# Patient Record
Sex: Female | Born: 1985 | Race: White | Smoking: Never smoker
Health system: Southern US, Community
[De-identification: ages and names within clinical notes are randomized; demographics above are authoritative.]

## PROBLEM LIST (undated history)

## (undated) DIAGNOSIS — G43909 Migraine, unspecified, not intractable, without status migrainosus: Secondary | ICD-10-CM

## (undated) HISTORY — PX: CHOLECYSTECTOMY: SHX55

## (undated) HISTORY — PX: UPPER GASTROINTESTINAL ENDOSCOPY: SHX188

## (undated) HISTORY — PX: GASTRECTOMY: SHX58

## (undated) HISTORY — PX: OTHER SURGICAL HISTORY: SHX169

---

## 2016-05-30 ENCOUNTER — Encounter: Payer: Self-pay | Admitting: Emergency Medicine

## 2016-05-30 ENCOUNTER — Emergency Department
Admission: EM | Admit: 2016-05-30 | Discharge: 2016-05-30 | Disposition: A | Discharge status: Home / Self Care | Attending: Emergency Medicine | Admitting: Emergency Medicine

## 2016-05-30 DIAGNOSIS — O99355 Diseases of the nervous system complicating the puerperium: Secondary | ICD-10-CM | POA: Diagnosis not present

## 2016-05-30 DIAGNOSIS — Z79899 Other long term (current) drug therapy: Secondary | ICD-10-CM | POA: Diagnosis not present

## 2016-05-30 DIAGNOSIS — G43009 Migraine without aura, not intractable, without status migrainosus: Secondary | ICD-10-CM | POA: Insufficient documentation

## 2016-05-30 HISTORY — DX: Migraine, unspecified, not intractable, without status migrainosus: G43.909

## 2016-05-30 MED ORDER — METOCLOPRAMIDE HCL 5 MG/ML IJ SOLN
10.0000 mg | Freq: Once | INTRAMUSCULAR | Status: AC
  Administered 2016-05-30: 10 mg via INTRAVENOUS
  Filled 2016-05-30: qty 2

## 2016-05-30 MED ORDER — SODIUM CHLORIDE 0.9 % IV BOLUS (SEPSIS)
500.0000 mL | Freq: Once | INTRAVENOUS | Status: AC
  Administered 2016-05-30: 500 mL via INTRAVENOUS

## 2016-05-30 MED ORDER — KETOROLAC TROMETHAMINE 30 MG/ML IJ SOLN
30.0000 mg | Freq: Once | INTRAMUSCULAR | Status: AC
Start: 2016-05-30 — End: 2016-05-30
  Administered 2016-05-30: 30 mg via INTRAVENOUS
  Filled 2016-05-30: qty 1

## 2016-05-30 NOTE — ED Provider Notes (Signed)
[facility] Emergency Department Provider Note  ____________________________________________  Time seen: Approximately 2:00 PM  I have reviewed the triage vital signs and the nursing notes.   HISTORY  Chief Complaint Headache    HPI Katherine Dillon is a 30 y.o. female , NAD, presents to the emergency department with 3 day history of migraine headache. Patient states she has a history of migraine headaches in the past. Once she became pregnant her headaches resolved. She is one week postpartum and noted had a frontal migraine headache began 3 days ago. The headache today is the same as headaches she's had in the past in regards to onset and severity but previous headaches resolved with over-the-counter medications. Patient has tried multiple over-the-counter medications with no resolution of her headache at this time. Patient denies any aura. Has had photophobia and phonophobia which is common with her previous headaches. Has had some nausea but no vomiting. Denies any loss of vision, floaters in her vision, chest pain, shortness breath, numbness, weakness, tingling, abdominal pain.   Past Medical History  Diagnosis Date  . Migraines     There are no active problems to display for this patient.   History reviewed. No pertinent past surgical history.  Current Outpatient Rx  Name  Route  Sig  Dispense  Refill  . oxyCODONE-acetaminophen (PERCOCET/ROXICET) 5-325 MG tablet   Oral   Take 1 tablet by mouth every 4 (four) hours as needed for severe pain.           Allergies Review of patient's allergies indicates no known allergies.  History reviewed. No pertinent family history.  Social History Social History  Substance Use Topics  . Smoking status: Never Smoker   . Smokeless tobacco: None  . Alcohol Use: No     Review of Systems  Constitutional: No fever/chills, fatigue Eyes: No visual changes, Loss of vision, floaters ENT: No sinus pressure/pain,  nasal congestion, runny nose. Cardiovascular: No chest pain, palpitations. Respiratory: No shortness of breath. No wheezing.  Gastrointestinal: Positive nausea. No abdominal pain.  No vomiting.   Musculoskeletal: Negative for neck pain.  Skin: Negative for rash. Neurological: Positive for frontal migraine headaches, but no focal weakness or numbness. No tingling. 10-point ROS otherwise negative.  ____________________________________________   PHYSICAL EXAM:  VITAL SIGNS: ED Triage Vitals  Enc Vitals Group     BP 05/30/16 1349 132/73 mmHg     Pulse Rate 05/30/16 1349 78     Resp 05/30/16 1349 20     Temp 05/30/16 1349 97.7 F (36.5 C)     Temp Source 05/30/16 1349 Oral     SpO2 05/30/16 1349 96 %     Weight 05/30/16 1349 249 lb (112.946 kg)     Height 05/30/16 1349  (1.702 m)     Head Cir --      Peak Flow --      Pain Score 05/30/16 1350 10     Pain Loc --      Pain Edu? --      Excl. in GC? --      Constitutional: Alert and oriented. Well appearing and in no acute distressSitting in a darkened exam room. Eyes: Conjunctivae are normal. PERRL. EOMI without pain.  Head: Atraumatic. Neck: Supple with full range of motion Hematological/Lymphatic/Immunilogical: No cervical lymphadenopathy. Cardiovascular: Normal rate, regular rhythm. Normal S1 and S2. No murmurs, rubs, gallops. Good peripheral circulation. Respiratory: Normal respiratory effort without tachypnea or retractions. Lungs CTAB with breath sounds noted in  all lung fields. Neurologic:  Normal speech and language. No gross focal neurologic deficits are appreciated.  Skin:  Skin is warm, dry and intact. No rash noted. Psychiatric: Mood and affect are normal. Speech and behavior are normal. Patient exhibits appropriate insight and judgement.   ____________________________________________    LABS  None ____________________________________________  EKG  None ____________________________________________  RADIOLOGY  None ____________________________________________    PROCEDURES  Procedure(s) performed: None    Medications  ketorolac (TORADOL) 30 MG/ML injection 30 mg (30 mg Intravenous Given 05/30/16 1439)  metoCLOPramide (REGLAN) injection 10 mg (10 mg Intravenous Given 05/30/16 1437)  sodium chloride 0.9 % bolus 500 mL (500 mLs Intravenous New Bag/Given 05/30/16 1437)   ----------------------------------------- 3:12 PM on 05/30/2016 -----------------------------------------  Patient notes headache had significantly decreased and reports pain as a 5 out of 10 at this time. Patient is tolerating oral medications without any side effects.  ____________________________________________   INITIAL IMPRESSION / ASSESSMENT AND PLAN / ED COURSE  Patient's diagnosis is consistent with Migraine without are and without status migrainosus, not intractable. Patient will be discharged home with instructions to follow up with her primary care provider or OB/GYN to discuss safe abortive headache medications that she may use that would be safe while breast-feeding.  Patient is given ED precautions to return to the ED for any worsening or new symptoms.    ____________________________________________  FINAL CLINICAL IMPRESSION(S) / ED DIAGNOSES  Final diagnoses:  Migraine without aura and without status migrainosus, not intractable      NEW MEDICATIONS STARTED DURING THIS VISIT:  New Prescriptions   No medications on file         Hope Pigeon, PA-C 05/30/16 1542   Arnaldo Natal, MD 06/10/16 [phone]

## 2016-05-30 NOTE — ED Notes (Signed)
Pt to ed with c/o headache x 3 days.  Pt is postpartum 1 week.  Pt was sent here by lactation consultant.  Pt reports she has hx of migraines prior to pregnancy but had none during pregnancy.  Pt tearful at triage.

## 2016-05-30 NOTE — ED Notes (Signed)
NAD noted at time of D/C. Pt denies questions or concerns. Pt ambulatory to the lobby at this time. Pt refused wheelchair to the lobby. Pt escorted to visitor lot at this time.

## 2016-05-30 NOTE — ED Notes (Signed)
Iv started and meds given  Lactation nurse at bedside to assist mother

## 2016-05-30 NOTE — Lactation Note (Signed)
Lactation Consultation Note  Patient Name: Czeslawa Weister LEXNT'Z Date: 05/30/2016  Mom came in for out patient consult, but was unable to complete d/t severe migraine and nausea.  Mom was pale in color and dehydrated.  Walked mom with baby to ER. Visited mom in ER with Migraine.  Discussed Reglan & Toradol as compatibility with breast feeding.  Once attending approved medication, we attempted to latch infant to full breast.  Ludwig Clarks has a tight frenulum which could explain difficulty with latching.  Mom has maintained milk supply by pumping using Spectra DEBP and bottlefeeding him since discharge from hospital.  Ludwig Clarks would not latch with or without nipple shield.  Tried SNS without success.  Mom was getting IV fluids and battling with headache so having a hard time concentrating.  2 1/2 oz of expressed breast milk given via bottle with slow flow nipple for this feeding.  Mom will continue to pump to maintain milk supply and keep trying to put baby to the breast.  If continues to be unsuccessful, mom plans to return to [facility] for out patient lactation consult.  Mom plans to discuss tight frenulum at Pediatrician appointment on Monday, June 01, 2016.  Maternal Data    Feeding    [facility] Score/Interventions                      Lactation Tools Discussed/Used     Consult Status      Louis Meckel 05/30/2016, 4:05 PM

## 2016-05-30 NOTE — Discharge Instructions (Signed)
Migraine Headache °A migraine headache is an intense, throbbing pain on one or both sides of your head. A migraine can last for 30 minutes to several hours. °CAUSES  °The exact cause of a migraine headache is not always known. However, a migraine may be caused when nerves in the brain become irritated and release chemicals that cause inflammation. This causes pain. °Certain things may also trigger migraines, such as: °· Alcohol. °· Smoking. °· Stress. °· Menstruation. °· Aged cheeses. °· Foods or drinks that contain nitrates, glutamate, aspartame, or tyramine. °· Lack of sleep. °· Chocolate. °· Caffeine. °· Hunger. °· Physical exertion. °· Fatigue. °· Medicines used to treat chest pain (nitroglycerine), birth control pills, estrogen, and some blood pressure medicines. °SIGNS AND SYMPTOMS °· Pain on one or both sides of your head. °· Pulsating or throbbing pain. °· Severe pain that prevents daily activities. °· Pain that is aggravated by any physical activity. °· Nausea, vomiting, or both. °· Dizziness. °· Pain with exposure to bright lights, loud noises, or activity. °· General sensitivity to bright lights, loud noises, or smells. °Before you get a migraine, you may get warning signs that a migraine is coming (aura). An aura may include: °· Seeing flashing lights. °· Seeing bright spots, halos, or zigzag lines. °· Having tunnel vision or blurred vision. °· Having feelings of numbness or tingling. °· Having trouble talking. °· Having muscle weakness. °DIAGNOSIS  °A migraine headache is often diagnosed based on: °· Symptoms. °· Physical exam. °· A CT scan or MRI of your head. These imaging tests cannot diagnose migraines, but they can help rule out other causes of headaches. °TREATMENT °Medicines may be given for pain and nausea. Medicines can also be given to help prevent recurrent migraines.  °HOME CARE INSTRUCTIONS °· Only take over-the-counter or prescription medicines for pain or discomfort as directed by your  health care provider. The use of long-term narcotics is not recommended. °· Lie down in a dark, quiet room when you have a migraine. °· Keep a journal to find out what may trigger your migraine headaches. For example, write down: °¨ What you eat and drink. °¨ How much sleep you get. °¨ Any change to your diet or medicines. °· Limit alcohol consumption. °· Quit smoking if you smoke. °· Get 7-9 hours of sleep, or as recommended by your health care provider. °· Limit stress. °· Keep lights dim if bright lights bother you and make your migraines worse. °SEEK IMMEDIATE MEDICAL CARE IF:  °· Your migraine becomes severe. °· You have a fever. °· You have a stiff neck. °· You have vision loss. °· You have muscular weakness or loss of muscle control. °· You start losing your balance or have trouble walking. °· You feel faint or pass out. °· You have severe symptoms that are different from your first symptoms. °MAKE SURE YOU:  °· Understand these instructions. °· Will watch your condition. °· Will get help right away if you are not doing well or get worse. °  °This information is not intended to replace advice given to you by your health care provider. Make sure you discuss any questions you have with your health care provider. °  °Document Released: 10/26/2005 Document Revised: 11/16/2014 Document Reviewed: 07/03/2013 °Elsevier Interactive Patient Education ©2016 [employer] ° °Recurrent Migraine Headache °A migraine headache is very bad, throbbing pain on one or both sides of your head. Recurrent migraines keep coming back. Talk to your doctor about what things may bring on (  trigger) your migraine headaches. °HOME CARE °· Only take medicines as told by your doctor. °· Lie down in a dark, quiet room when you have a migraine. °· Keep a journal to find out if certain things bring on migraine headaches. For example, write down: °¨ What you eat and drink. °¨ How much sleep you get. °¨ Any change to your diet or  medicines. °· Lessen how much alcohol you drink. °· Quit smoking if you smoke. °· Get enough sleep. °· Lessen any stress in your life. °· Keep lights dim if bright lights bother you or make your migraines worse. °GET HELP IF: °· Medicine does not help your migraines. °· Your pain keeps coming back. °· You have a fever. °GET HELP RIGHT AWAY IF:  °· Your migraine becomes really bad. °· You have a stiff neck. °· You have trouble seeing. °· Your muscles are weak, or you lose muscle control. °· You lose your balance or have trouble walking. °· You feel like you will pass out (faint), or you pass out. °· You have really bad symptoms that are different than your first symptoms. °MAKE SURE YOU:  °· Understand these instructions. °· Will watch your condition. °· Will get help right away if you are not doing well or get worse. °  °This information is not intended to replace advice given to you by your health care provider. Make sure you discuss any questions you have with your health care provider. °  °Document Released: 08/04/2008 Document Revised: 10/31/2013 Document Reviewed: 07/03/2013 °Elsevier Interactive Patient Education ©2016 [employer] ° °

## 2016-05-30 NOTE — ED Notes (Signed)
See triage note  Post partum 1 week  Developed headache 3 days ago  Positive nausea states she has a hx of migraines and usually takes OTC meds with relief  Has tried tylenol,bc and ibu  Also has tried oxycodone that she has for c-section  No relief

## 2017-03-11 ENCOUNTER — Ambulatory Visit
Admission: EM | Admit: 2017-03-11 | Discharge: 2017-03-11 | Disposition: A | Discharge status: Home / Self Care | Attending: Family Medicine | Admitting: Family Medicine

## 2017-03-11 DIAGNOSIS — Z3202 Encounter for pregnancy test, result negative: Secondary | ICD-10-CM

## 2017-03-11 DIAGNOSIS — N939 Abnormal uterine and vaginal bleeding, unspecified: Secondary | ICD-10-CM | POA: Diagnosis not present

## 2017-03-11 DIAGNOSIS — Z202 Contact with and (suspected) exposure to infections with a predominantly sexual mode of transmission: Secondary | ICD-10-CM | POA: Diagnosis not present

## 2017-03-11 DIAGNOSIS — R319 Hematuria, unspecified: Secondary | ICD-10-CM

## 2017-03-11 LAB — CHLAMYDIA/NGC RT PCR (ARMC ONLY)
Chlamydia Tr: NOT DETECTED
N gonorrhoeae: NOT DETECTED

## 2017-03-11 LAB — URINALYSIS, COMPLETE (UACMP) WITH MICROSCOPIC
BILIRUBIN URINE: NEGATIVE
GLUCOSE, UA: NEGATIVE mg/dL
LEUKOCYTES UA: NEGATIVE
Nitrite: NEGATIVE
PH: 6 (ref 5.0–8.0)
Protein, ur: 100 mg/dL — AB

## 2017-03-11 LAB — WET PREP, GENITAL
CLUE CELLS WET PREP: NONE SEEN
SPERM: NONE SEEN
TRICH WET PREP: NONE SEEN
WBC WET PREP: NONE SEEN
YEAST WET PREP: NONE SEEN

## 2017-03-11 LAB — PREGNANCY, URINE: PREG TEST UR: NEGATIVE

## 2017-03-11 NOTE — ED Triage Notes (Addendum)
Patient complains of hematuria, frequency, urgency and painful urination. Patient states that she started having symptoms this morning. Patient states that husband has been cheating on her for approx 6 months and would like to be checked for STDS.

## 2017-03-11 NOTE — ED Provider Notes (Signed)
[facility]    CSN: [id number] Arrival date & time: 03/11/17  1603     History   Chief Complaint Chief Complaint  Patient presents with  . Hematuria    HPI Katherine Dillon is a 31 y.o. female.   Patient is a 31 year old white female who knows bloody urine today. She states that her last menstrual period ended about a week ago. She's always been regular no problem. She denies any dysuria frequency but was concerned because blood in her urine. She's had no back pain no pain traveling into her flank area. Explained to her that her urine shows vaginal cells present indicating a poor quality specimen but there was no leukocytes no nitrites no signs of WBCs just blood and epithelial cells. Offered to treat for 3 days for antibiotic await urine culture to see if she needs further treatment and if her symptoms persist she may need have a pelvic exam by her PCP. It is at this time she has informed me that she wants to have a pelvic exam and STD evaluation because her husband she is on her for 6 months and she's not had relations with him for about 2 months. She did have relations 2 nights ago on Tuesday night this being Thursday but did not have any trouble. And she is informing me that she was intelligent up to make sure he used protection. She was going to have blood tests for HIV and syphilis done tonight as well. Past medical history migraines. C-sections 3 she is a gravida 5. No pertinent family medical history relevant to today's visit she's never smoked. NKDA.   The history is provided by the patient. No language interpreter was used.  Hematuria  This is a new problem. The current episode started 6 to 12 hours ago. The problem occurs constantly. The problem has not changed since onset.Pertinent negatives include no chest pain, no abdominal pain, no headaches and no shortness of breath. Nothing relieves the symptoms. She has tried nothing for the symptoms.  Exposure to STD  This is a  new problem. The problem occurs constantly. The problem has not changed since onset.Pertinent negatives include no chest pain, no abdominal pain, no headaches and no shortness of breath. Nothing aggravates the symptoms. Nothing relieves the symptoms. She has tried nothing for the symptoms. The treatment provided no relief.    Past Medical History:  Diagnosis Date  . Migraines     There are no active problems to display for this patient.   Past Surgical History:  Procedure Laterality Date  . CESAREAN SECTION     X 3  . UPPER GASTROINTESTINAL ENDOSCOPY      OB History    Gravida Para Term Preterm AB Living   5       2 3    SAB TAB Ectopic Multiple Live Births   1               Home Medications    Prior to Admission medications   Medication Sig Start Date End Date Taking? Authorizing Provider  oxyCODONE-acetaminophen (PERCOCET/ROXICET) 5-325 MG tablet Take 1 tablet by mouth every 4 (four) hours as needed for severe pain.    Historical Provider, MD    Family History History reviewed. No pertinent family history.  Social History Social History  Substance Use Topics  . Smoking status: Never Smoker  . Smokeless tobacco: Never Used  . Alcohol use No     Allergies   Patient has no known  allergies.   Review of Systems Review of Systems  Respiratory: Negative for shortness of breath.   Cardiovascular: Negative for chest pain.  Gastrointestinal: Negative for abdominal pain.  Genitourinary: Positive for hematuria.  Neurological: Negative for headaches.  All other systems reviewed and are negative.    Physical Exam Triage Vital Signs ED Triage Vitals  Enc Vitals Group     BP 03/11/17 1636 119/78     Pulse Rate 03/11/17 1636 71     Resp 03/11/17 1636 18     Temp 03/11/17 1636 98.4 F (36.9 C)     Temp Source 03/11/17 1636 Oral     SpO2 03/11/17 1636 98 %     Weight 03/11/17 1633 247 lb (112 kg)     Height 03/11/17 1633 5\' 7"  (1.702 m)     Head Circumference  --      Peak Flow --      Pain Score 03/11/17 1633 5     Pain Loc --      Pain Edu? --      Excl. in GC? --    No data found.   Updated Vital Signs BP 119/78 (BP Location: Left Arm)   Pulse 71   Temp 98.4 F (36.9 C) (Oral)   Resp 18   Ht 5\' 7"  (1.702 m)   Wt 247 lb (112 kg)   LMP 03/01/2017   SpO2 98%   BMI 38.69 kg/m   Visual Acuity Right Eye Distance:   Left Eye Distance:   Bilateral Distance:    Right Eye Near:   Left Eye Near:    Bilateral Near:     Physical Exam  Constitutional: She is oriented to person, place, and time. She appears well-developed and well-nourished.  HENT:  Head: Normocephalic and atraumatic.  Left Ear: External ear normal.  Mouth/Throat: Oropharynx is clear and moist.  Eyes: Pupils are equal, round, and reactive to light.  Neck: Normal range of motion.  Pulmonary/Chest: Effort normal.  Abdominal: Soft. Bowel sounds are normal. She exhibits no distension. There is no tenderness. Hernia confirmed negative in the left inguinal area.  Genitourinary: Rectum normal and uterus normal. Rectal exam shows no tenderness. There is no rash on the right labia. There is no rash on the left labia. Right adnexum displays no mass. Left adnexum displays no mass. There is erythema and bleeding in the vagina.  Genitourinary Comments: Patient had blood present in the vaginal vault she also had blood around the introitus as well. Obviously apparently the cause of the blood in the urine. Despite multiple attempts unable to manipulate the vaginal walls well enough to see the cervix. Blood was removed from the vaginal vault with ring forceps. Then attempts were made to find manually palpate the uterus unable to get a good feel of the cervix even with palpation on the fundus no foreign objects so could be detected with examination and visualization but patient was warned that something could still been missed. Rectal examination was unremarkable.  Musculoskeletal: Normal  range of motion.  Neurological: She is alert and oriented to person, place, and time.  Skin: Skin is warm.  Psychiatric: She has a normal mood and affect. Her behavior is normal.  Vitals reviewed.    UC Treatments / Results  Labs (all labs ordered are listed, but only abnormal results are displayed) Labs Reviewed  URINALYSIS, COMPLETE (UACMP) WITH MICROSCOPIC - Abnormal; Notable for the following:       Result Value   Color,  Urine PINK (*)    APPearance CLOUDY (*)    Specific Gravity, Urine >1.030 (*)    Hgb urine dipstick LARGE (*)    Ketones, ur TRACE (*)    Protein, ur 100 (*)    Squamous Epithelial / LPF 6-30 (*)    Bacteria, UA FEW (*)    All other components within normal limits  WET PREP, GENITAL  URINE CULTURE  CHLAMYDIA/NGC RT PCR ([facility] ONLY)  PREGNANCY, URINE  RPR  HIV ANTIBODY (ROUTINE TESTING)    EKG  EKG Interpretation None       Radiology No results found.  Procedures Procedures (including critical care time)  Medications Ordered in UC Medications - No data to display   Initial Impression / Assessment and Plan / UC Course  I have reviewed the triage vital signs and the nursing notes.  Pertinent labs & imaging results that were available during my care of the patient were reviewed by me and considered in my medical decision making (see chart for details).     Patient presents with hematuria but the issue is vaginal bleeding and not hematuria. GC can meet test was done Rosey Batheresa were prepped test done and pending HIV GC culture was also obtained urine culture was obtained also asked for urine pregnancy. If the urine (test is negative will treat the wet prep if needed. We'll see what the urine culture shows the patient needs follow-up for PCP if she continues to have bleeding    Curettes were prepped negative urine pregnancy negative will assume this is just dysfunctional bleeding and monitor patient treat any cultures past. Final Clinical  Impressions(s) / UC Diagnoses   Final diagnoses:  Hematuria, unspecified type  Vagina bleeding  Potential exposure to STD    New Prescriptions New Prescriptions   No medications on file  Note: This dictation was prepared with Dragon dictation along with smaller phrase technology. Any transcriptional errors that result from this process are unintentional.   Hassan RowanEugene Jodilyn Giese, MD 03/11/17 [phone]

## 2017-03-12 LAB — HIV ANTIBODY (ROUTINE TESTING W REFLEX): HIV Screen 4th Generation wRfx: NONREACTIVE

## 2017-03-12 LAB — RPR: RPR Ser Ql: NONREACTIVE

## 2017-03-13 ENCOUNTER — Telehealth: Payer: Self-pay | Admitting: *Deleted

## 2017-03-13 LAB — URINE CULTURE: Special Requests: NORMAL

## 2017-03-13 MED ORDER — AMOXICILLIN-POT CLAVULANATE 875-125 MG PO TABS: 1.0000 | ORAL_TABLET | Freq: Two times a day (BID) | ORAL | 0 refills | Status: AC

## 2017-05-16 ENCOUNTER — Encounter: Payer: Self-pay | Admitting: Emergency Medicine

## 2017-05-16 ENCOUNTER — Emergency Department

## 2017-05-16 ENCOUNTER — Emergency Department
Admission: EM | Admit: 2017-05-16 | Discharge: 2017-05-16 | Disposition: A | Discharge status: Home / Self Care | Attending: Emergency Medicine | Admitting: Emergency Medicine

## 2017-05-16 DIAGNOSIS — R1031 Right lower quadrant pain: Secondary | ICD-10-CM | POA: Insufficient documentation

## 2017-05-16 DIAGNOSIS — N83291 Other ovarian cyst, right side: Secondary | ICD-10-CM | POA: Diagnosis not present

## 2017-05-16 DIAGNOSIS — N83209 Unspecified ovarian cyst, unspecified side: Secondary | ICD-10-CM

## 2017-05-16 DIAGNOSIS — R110 Nausea: Secondary | ICD-10-CM | POA: Insufficient documentation

## 2017-05-16 DIAGNOSIS — Z79891 Long term (current) use of opiate analgesic: Secondary | ICD-10-CM | POA: Insufficient documentation

## 2017-05-16 DIAGNOSIS — R1010 Upper abdominal pain, unspecified: Secondary | ICD-10-CM | POA: Diagnosis present

## 2017-05-16 LAB — URINALYSIS, COMPLETE (UACMP) WITH MICROSCOPIC
BILIRUBIN URINE: NEGATIVE
Bacteria, UA: NONE SEEN
GLUCOSE, UA: NEGATIVE mg/dL
KETONES UR: 80 mg/dL — AB
LEUKOCYTES UA: NEGATIVE
Nitrite: NEGATIVE
PH: 6 (ref 5.0–8.0)
Protein, ur: 30 mg/dL — AB
Specific Gravity, Urine: 1.023 (ref 1.005–1.030)

## 2017-05-16 LAB — CBC
HCT: 37.1 % (ref 35.0–47.0)
HEMOGLOBIN: 12.3 g/dL (ref 12.0–16.0)
MCH: 26.2 pg (ref 26.0–34.0)
MCHC: 33.1 g/dL (ref 32.0–36.0)
MCV: 79.1 fL — AB (ref 80.0–100.0)
PLATELETS: 209 10*3/uL (ref 150–440)
RBC: 4.69 MIL/uL (ref 3.80–5.20)
RDW: 17.1 % — ABNORMAL HIGH (ref 11.5–14.5)
WBC: 6.7 10*3/uL (ref 3.6–11.0)

## 2017-05-16 LAB — COMPREHENSIVE METABOLIC PANEL
ALT: 12 U/L — AB (ref 14–54)
ANION GAP: 11 (ref 5–15)
AST: 13 U/L — ABNORMAL LOW (ref 15–41)
Albumin: 3.7 g/dL (ref 3.5–5.0)
Alkaline Phosphatase: 47 U/L (ref 38–126)
BUN: 10 mg/dL (ref 6–20)
CHLORIDE: 107 mmol/L (ref 101–111)
CO2: 22 mmol/L (ref 22–32)
CREATININE: 0.52 mg/dL (ref 0.44–1.00)
Calcium: 8.9 mg/dL (ref 8.9–10.3)
GFR calc non Af Amer: >60 mL/min (ref >60)
Glucose, Bld: 95 mg/dL (ref 65–99)
POTASSIUM: 2.8 mmol/L — AB (ref 3.5–5.1)
Sodium: 140 mmol/L (ref 135–145)
Total Bilirubin: 0.8 mg/dL (ref 0.3–1.2)
Total Protein: 6.6 g/dL (ref 6.5–8.1)

## 2017-05-16 LAB — POCT PREGNANCY, URINE: Preg Test, Ur: NEGATIVE

## 2017-05-16 LAB — LIPASE, BLOOD: LIPASE: 26 U/L (ref 11–51)

## 2017-05-16 MED ORDER — POTASSIUM CHLORIDE CRYS ER 20 MEQ PO TBCR
40.0000 meq | EXTENDED_RELEASE_TABLET | Freq: Once | ORAL | Status: AC
  Administered 2017-05-16: 40 meq via ORAL
  Filled 2017-05-16: qty 2

## 2017-05-16 MED ORDER — ONDANSETRON HCL 4 MG/2ML IJ SOLN
4.0000 mg | Freq: Once | INTRAMUSCULAR | Status: AC
  Administered 2017-05-16: 4 mg via INTRAVENOUS
  Filled 2017-05-16: qty 2

## 2017-05-16 MED ORDER — SODIUM CHLORIDE 0.9 % IV BOLUS (SEPSIS)
1000.0000 mL | Freq: Once | INTRAVENOUS | Status: AC
  Administered 2017-05-16: 1000 mL via INTRAVENOUS

## 2017-05-16 MED ORDER — MORPHINE SULFATE (PF) 2 MG/ML IV SOLN
2.0000 mg | Freq: Once | INTRAVENOUS | Status: AC
  Administered 2017-05-16: 2 mg via INTRAVENOUS
  Filled 2017-05-16: qty 1

## 2017-05-16 NOTE — ED Notes (Addendum)
Explained to pt that she will need a ride home d/t morphine administration. Pt verbalized understanding and has called family member to come and pick her up. Pt discharged to lobby to wait for ride.

## 2017-05-16 NOTE — ED Notes (Signed)
Patient transported to Ultrasound 

## 2017-05-16 NOTE — ED Notes (Signed)

## 2017-05-16 NOTE — ED Provider Notes (Signed)
[facility] Emergency Department Provider Note  ____________________________________________  Time seen: Approximately 8:10 AM  I have reviewed the triage vital signs and the nursing notes.   HISTORY  Chief Complaint Abdominal Pain   HPI Katherine Dillon is a 31 y.o. female with a history of migraine headaches, and gastrectomy who presents for evaluation of abdominal pain. Patient reports that the pain woke her up from her sleep this morning, severe, contraction like pain located in the right side of her abdomen, constant and nonradiating. Associated with nausea but no vomiting. LMP was a month ago. Patient status post tubal ligation. No dysuria or hematuria, no constipation or diarrhea, no vaginal discharge, no fever or chills. Pain is currently 8 out of 10. Patient denies ever having similar pain in the past. No history of gallstones, kidney stones, or ovarian cysts.  Past Medical History:  Diagnosis Date  . Migraines     There are no active problems to display for this patient.   Past Surgical History:  Procedure Laterality Date  . CESAREAN SECTION     X 3  . UPPER GASTROINTESTINAL ENDOSCOPY      Prior to Admission medications   Medication Sig Start Date End Date Taking? Authorizing Provider  oxyCODONE-acetaminophen (PERCOCET/ROXICET) 5-325 MG tablet Take 1 tablet by mouth every 4 (four) hours as needed for severe pain.    [provider]    Allergies Nsaids  No family history on file.  Social History Social History  Substance Use Topics  . Smoking status: Never Smoker  . Smokeless tobacco: Never Used  . Alcohol use No    Review of Systems  Constitutional: Negative for fever. Eyes: Negative for visual changes. ENT: Negative for sore throat. Neck: No neck pain  Cardiovascular: Negative for chest pain. Respiratory: Negative for shortness of breath. Gastrointestinal: + R sided abdominal pain and nausea. No vomiting or  diarrhea. Genitourinary: Negative for dysuria. Musculoskeletal: Negative for back pain. Skin: Negative for rash. Neurological: Negative for headaches, weakness or numbness. Psych: No SI or HI  ____________________________________________   PHYSICAL EXAM:  VITAL SIGNS: ED Triage Vitals  Enc Vitals Group     BP 05/16/17 0707 (!) 107/55     Pulse Rate 05/16/17 0707 66     Resp 05/16/17 0707 (!) 22     Temp 05/16/17 0707 97.7 F (36.5 C)     Temp Source 05/16/17 0707 Oral     SpO2 05/16/17 0707 100 %     Weight 05/16/17 0708 217 lb (98.4 kg)     Height 05/16/17 0708 5\' 7"  (1.702 m)     Head Circumference --      Peak Flow --      Pain Score 05/16/17 0707 10     Pain Loc --      Pain Edu? --      Excl. in GC? --     Constitutional: Alert and oriented. Well appearing and in no apparent distress. HEENT:      Head: Normocephalic and atraumatic.         Eyes: Conjunctivae are normal. Sclera is non-icteric.       Mouth/Throat: Mucous membranes are moist.       Neck: Supple with no signs of meningismus. Cardiovascular: Regular rate and rhythm. No murmurs, gallops, or rubs. 2+ symmetrical distal pulses are present in all extremities. No JVD. Respiratory: Normal respiratory effort. Lungs are clear to auscultation bilaterally. No wheezes, crackles, or rhonchi.  Gastrointestinal: Soft, ttp over  the RLQ, non distended with positive bowel sounds. No rebound or guarding. Genitourinary: No CVA tenderness. Musculoskeletal: Nontender with normal range of motion in all extremities. No edema, cyanosis, or erythema of extremities. Neurologic: Normal speech and language. Face is symmetric. Moving all extremities. No gross focal neurologic deficits are appreciated. Skin: Skin is warm, dry and intact. No rash noted. Psychiatric: Mood and affect are normal. Speech and behavior are normal.  ____________________________________________   LABS (all labs ordered are listed, but only abnormal  results are displayed)  Labs Reviewed  COMPREHENSIVE METABOLIC PANEL - Abnormal; Notable for the following:       Result Value   Potassium 2.8 (*)    AST 13 (*)    ALT 12 (*)    All other components within normal limits  CBC - Abnormal; Notable for the following:    MCV 79.1 (*)    RDW 17.1 (*)    All other components within normal limits  URINALYSIS, COMPLETE (UACMP) WITH MICROSCOPIC - Abnormal; Notable for the following:    Color, Urine YELLOW (*)    APPearance HAZY (*)    Hgb urine dipstick MODERATE (*)    Ketones, ur 80 (*)    Protein, ur 30 (*)    Squamous Epithelial / LPF 0-5 (*)    All other components within normal limits  LIPASE, BLOOD  POC URINE PREG, ED  POCT PREGNANCY, URINE   ____________________________________________  EKG  none  ____________________________________________  RADIOLOGY  TVUS: Arteriovenous waveforms demonstrated within normal sized ovaries bilaterally. No sonographic evidence to suggest torsion.  Within the right ovary there is a prominent follicle versus hemorrhagic cyst, poorly visualized on current exam. Recommend follow-up pelvic ultrasound in 6-8 weeks to ensure resolution.  CT renal: 1. Nonobstructing left renal stones. Otherwise no urinary stone disease. 2. No acute findings to explain the patient's history of right upper quadrant abdominal pain. ____________________________________________   PROCEDURES  Procedure(s) performed: None Procedures Critical Care performed:  None ____________________________________________   INITIAL IMPRESSION / ASSESSMENT AND PLAN / ED COURSE  31 y.o. female with a history of migraine headaches, and gastrectomy who presents for evaluation of sudden onset severe R sided abdominal pain associated with nausea. Patient is well-appearing and in no distress, her vital signs are within normal limits, she is tender to palpation on the right lower quadrant with no rebound or guarding. Based on the  description of the pain differential diagnoses includes ovarian torsion versus ovarian cyst versus kidney stone. Less likely appendicitis with sudden onset of severe pain that is also in my differential diagnosis. Gallbladder disease also possibility. We'll send patient for transvaginal ultrasound to rule out ovarian pathology. We'll get basic blood work, urinalysis and pregnancy test. We'll treat her pain with IV morphine, fluids and Zofran.   ED course:  Pelvic ultrasound concerning for a right hemorrhagic cyst. Patient found to have hematuria in her urine and therefore she was sent for CT renal to evaluate for kidney stone. CT showing evidence of left renal stones but no ureteral stones. Patient's pain is well-controlled. There is no evidence of torsion. Discussed signs and symptoms of torsion and recommended she return to the emergency room. Patient will be discharged home on supportive care at this time and close follow-up with primary care doctor for repeat ultrasound in 6-8 weeks.  Pertinent labs & imaging results that were available during my care of the patient were reviewed by me and considered in my medical decision making (see chart for details).  ____________________________________________   FINAL CLINICAL IMPRESSION(S) / ED DIAGNOSES  Final diagnoses:  RLQ abdominal pain  Hemorrhagic cyst of ovary      NEW MEDICATIONS STARTED DURING THIS VISIT:  New Prescriptions   No medications on file     Note:  This document was prepared using Dragon voice recognition software and may include unintentional dictation errors.    Don Perking, [place], MD 05/16/17 1057

## 2017-05-16 NOTE — ED Triage Notes (Signed)
Pt arrived via POV from home with reports of right upper quad abdominal pain that started about 1 hour prior arrival. Pt describes the pain as contraction pain. Denies V/D, but does endorse nausea.

## 2017-08-17 ENCOUNTER — Ambulatory Visit
Admission: EM | Admit: 2017-08-17 | Discharge: 2017-08-17 | Disposition: A | Discharge status: Home / Self Care | Attending: Emergency Medicine | Admitting: Emergency Medicine

## 2017-08-17 ENCOUNTER — Encounter: Payer: Self-pay | Admitting: Emergency Medicine

## 2017-08-17 DIAGNOSIS — Z113 Encounter for screening for infections with a predominantly sexual mode of transmission: Secondary | ICD-10-CM | POA: Diagnosis not present

## 2017-08-17 LAB — CHLAMYDIA/NGC RT PCR (ARMC ONLY)
Chlamydia Tr: NOT DETECTED
N gonorrhoeae: NOT DETECTED

## 2017-08-17 LAB — WET PREP, GENITAL
Sperm: NONE SEEN
Trich, Wet Prep: NONE SEEN
YEAST WET PREP: NONE SEEN

## 2017-08-17 NOTE — ED Triage Notes (Signed)
Patient in today to be screening for hepatitis. Patient's boyfriend's ex-girlfriend told him that she has hepatitis. Patient requests to be tested for Hepatitis and STDs.

## 2017-08-17 NOTE — ED Provider Notes (Signed)
[facility] ____________________________________________  Time seen: Approximately 8:42 AM  I have reviewed the triage vital signs and the nursing notes.   HISTORY  Chief Complaint screening for hepatitis   HPI Katherine Dillon is a 31 y.o. female  presenting for request of STD screening and hepatitis testing. Patient reports she just wanted to get checked to make sure. Patient states her current boyfriend's ex-girlfriend reported that she had hepatitis, unsure of details. Patient states that her boyfriend has been tested through his work and was negative. Patient states that she wants to just make sure she is okay. Patient denies any symptoms. She reports that she feels well. Denies any fevers, abdominal pain, back pain, chest pain, shortness of breath, dysuria, vaginal discharge, vaginal pain, lesions or rash. Reports that she does have a history of cold sores, denies any recent. Denies recent sickness. Denies recent antibiotic use.   Patient's last menstrual period was 08/05/2017. Denies pregnancy.   Past Medical History:  Diagnosis Date  . Migraines     There are no active problems to display for this patient.   Past Surgical History:  Procedure Laterality Date  . CESAREAN SECTION     X 3  . tubaligation    . UPPER GASTROINTESTINAL ENDOSCOPY       No current facility-administered medications for this encounter.   Current Outpatient Prescriptions:  .  oxyCODONE-acetaminophen (PERCOCET/ROXICET) 5-325 MG tablet, Take 1 tablet by mouth every 4 (four) hours as needed for severe pain., Disp: , Rfl:   Allergies Nsaids  No family history on file.  Social History Social History  Substance Use Topics  . Smoking status: Never Smoker  . Smokeless tobacco: Never Used  . Alcohol use No    Review of Systems Constitutional: No fever/chills Cardiovascular: Denies chest pain. Respiratory: Denies shortness of breath. Gastrointestinal: No abdominal pain.  No nausea,  no vomiting.  No diarrhea.   Genitourinary: Negative for dysuria. Musculoskeletal: Negative for back pain. Skin: Negative for rash.   ____________________________________________   PHYSICAL EXAM:  VITAL SIGNS: ED Triage Vitals  Enc Vitals Group     BP 08/17/17 0817 96/72     Pulse Rate 08/17/17 0817 (!) 56     Resp 08/17/17 0817 16     Temp 08/17/17 0817 98.5 F (36.9 C)     Temp Source 08/17/17 0817 Oral     SpO2 08/17/17 0817 100 %     Weight 08/17/17 0819 184 lb (83.5 kg)     Height 08/17/17 0819  (1.702 m)     Head Circumference --      Peak Flow --      Pain Score 08/17/17 0820 0     Pain Loc --      Pain Edu? --      Excl. in GC? --     Constitutional: Alert and oriented. Well appearing and in no acute distress. Cardiovascular: Normal rate, regular rhythm. Grossly normal heart sounds.  Good peripheral circulation. Respiratory: Normal respiratory effort without tachypnea nor retractions. Breath sounds are clear and equal bilaterally. No wheezes, rales, rhonchi Neurologic:  Normal speech and language. NSpeech is normal. No gait instability.  Skin:  Skin is warm, dry and intact. No rash noted. Psychiatric: Mood and affect are normal. Speech and behavior are normal. Patient exhibits appropriate insight and judgment   ___________________________________________   LABS (all labs ordered are listed, but only abnormal results are displayed)  Labs Reviewed  WET PREP, GENITAL - Abnormal; Notable for  the following:       Result Value   Clue Cells Wet Prep HPF POC PRESENT (*)    WBC, Wet Prep HPF POC FEW (*)    All other components within normal limits  CHLAMYDIA/NGC RT PCR ([facility] ONLY)  HEPATITIS PANEL, ACUTE  RPR  HIV ANTIBODY (ROUTINE TESTING)  HSV(HERPES SIMPLEX VRS) I + II AB-IGG  HSV(HERPES SIMPLEX VRS) I + II AB-IGM    PROCEDURES Procedures   INITIAL IMPRESSION / ASSESSMENT AND PLAN / ED COURSE  Pertinent labs & imaging results that were  available during my care of the patient were reviewed by me and considered in my medical decision making (see chart for details).  Well-appearing patient. No acute distress. Patient denies symptoms. Requests STD testing and hepatitis testing. Will evaluate for HIV, RPR, hepatitis, herpes, gonorrhea, chlamydia, trichomonas or wet prep. Patient denied symptoms and perform self wet prep. Encourage safe sex practices. Also discussed with patient repeat follow-up especially for HIV and hepatitis in 2-3 months. Discussed follow-up with primary care or health department.  Discussed follow up and return parameters including no resolution or any worsening concerns. Patient verbalized understanding and agreed to plan.   ____________________________________________   FINAL CLINICAL IMPRESSION(S) / ED DIAGNOSES  Final diagnoses:  Screen for STD (sexually transmitted disease)     Discharge Medication List as of 08/17/2017  8:41 AM      Note: This dictation was prepared with Dragon dictation along with smaller phrase technology. Any transcriptional errors that result from this process are unintentional.         Renford Dills, NP 08/17/17 [phone]

## 2017-08-17 NOTE — Discharge Instructions (Signed)
Follow up as discussed. Return to Urgent care for new or worsening concerns.

## 2017-08-18 LAB — HSV(HERPES SIMPLEX VRS) I + II AB-IGM: HSVI/II Comb IgM: 1.12 Ratio — ABNORMAL HIGH (ref 0.00–0.90)

## 2017-08-18 LAB — HSV(HERPES SIMPLEX VRS) I + II AB-IGG: HSV 1 Glycoprotein G Ab, IgG: 25.7 index — ABNORMAL HIGH (ref 0.00–0.90)

## 2017-08-18 LAB — RPR: RPR: NONREACTIVE

## 2017-08-18 LAB — HEPATITIS PANEL, ACUTE
HEP A IGM: NEGATIVE
HEP B C IGM: NEGATIVE
HEP B S AG: NEGATIVE

## 2017-08-18 LAB — HIV ANTIBODY (ROUTINE TESTING W REFLEX): HIV Screen 4th Generation wRfx: NONREACTIVE

## 2017-12-03 ENCOUNTER — Emergency Department
Admission: EM | Admit: 2017-12-03 | Discharge: 2017-12-04 | Disposition: A | Discharge status: Home / Self Care | Attending: Emergency Medicine | Admitting: Emergency Medicine

## 2017-12-03 ENCOUNTER — Emergency Department

## 2017-12-03 ENCOUNTER — Encounter: Payer: Self-pay | Admitting: Emergency Medicine

## 2017-12-03 DIAGNOSIS — Z9049 Acquired absence of other specified parts of digestive tract: Secondary | ICD-10-CM | POA: Diagnosis not present

## 2017-12-03 DIAGNOSIS — R1011 Right upper quadrant pain: Secondary | ICD-10-CM | POA: Diagnosis not present

## 2017-12-03 DIAGNOSIS — N390 Urinary tract infection, site not specified: Secondary | ICD-10-CM | POA: Insufficient documentation

## 2017-12-03 DIAGNOSIS — Z903 Acquired absence of stomach [part of]: Secondary | ICD-10-CM | POA: Insufficient documentation

## 2017-12-03 LAB — CBC
HEMATOCRIT: 37.2 % (ref 35.0–47.0)
Hemoglobin: 12.4 g/dL (ref 12.0–16.0)
MCH: 26.3 pg (ref 26.0–34.0)
MCHC: 33.4 g/dL (ref 32.0–36.0)
MCV: 78.8 fL — AB (ref 80.0–100.0)
Platelets: 267 10*3/uL (ref 150–440)
RBC: 4.72 MIL/uL (ref 3.80–5.20)
RDW: 15.1 % — ABNORMAL HIGH (ref 11.5–14.5)
WBC: 9.5 10*3/uL (ref 3.6–11.0)

## 2017-12-03 LAB — URINALYSIS, COMPLETE (UACMP) WITH MICROSCOPIC
Bilirubin Urine: NEGATIVE
Glucose, UA: NEGATIVE mg/dL
Hgb urine dipstick: NEGATIVE
Ketones, ur: 5 mg/dL — AB
Leukocytes, UA: NEGATIVE
Nitrite: POSITIVE — AB
Protein, ur: NEGATIVE mg/dL
SPECIFIC GRAVITY, URINE: 1.027 (ref 1.005–1.030)
pH: 5 (ref 5.0–8.0)

## 2017-12-03 LAB — COMPREHENSIVE METABOLIC PANEL
ALBUMIN: 4.5 g/dL (ref 3.5–5.0)
ALT: 17 U/L (ref 14–54)
AST: 21 U/L (ref 15–41)
Alkaline Phosphatase: 49 U/L (ref 38–126)
Anion gap: 7 (ref 5–15)
BILIRUBIN TOTAL: 0.6 mg/dL (ref 0.3–1.2)
BUN: 17 mg/dL (ref 6–20)
CHLORIDE: 106 mmol/L (ref 101–111)
CO2: 27 mmol/L (ref 22–32)
Calcium: 9.6 mg/dL (ref 8.9–10.3)
Creatinine, Ser: 0.59 mg/dL (ref 0.44–1.00)
GFR calc Af Amer: >60 mL/min (ref >60)
GFR calc non Af Amer: >60 mL/min (ref >60)
GLUCOSE: 93 mg/dL (ref 65–99)
Potassium: 3.9 mmol/L (ref 3.5–5.1)
Sodium: 140 mmol/L (ref 135–145)
Total Protein: 7.5 g/dL (ref 6.5–8.1)

## 2017-12-03 LAB — LIPASE, BLOOD: LIPASE: 33 U/L (ref 11–51)

## 2017-12-03 LAB — PREGNANCY, URINE: Preg Test, Ur: NEGATIVE

## 2017-12-03 MED ORDER — NITROFURANTOIN MONOHYD MACRO 100 MG PO CAPS
100.0000 mg | ORAL_CAPSULE | Freq: Once | ORAL | Status: AC
  Administered 2017-12-03: 100 mg via ORAL
  Filled 2017-12-03: qty 1

## 2017-12-03 MED ORDER — NITROFURANTOIN MONOHYD MACRO 100 MG PO CAPS: 100.0000 mg | ORAL_CAPSULE | Freq: Two times a day (BID) | ORAL | 0 refills | Status: AC

## 2017-12-03 NOTE — ED Provider Notes (Signed)
[facility] Emergency Department Provider Note  ____________________________________________   First MD Initiated Contact with Patient 12/03/17 2203     (approximate)  I have reviewed the triage vital signs and the nursing notes.   HISTORY  Chief Complaint Abdominal Pain   HPI Ilene Quadna Arteaga is a 32 y.o. 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  She states that she had a ch[facility]cyst[facility] this past November [facility] where she also had her sleeve gastrectomy.  She says that she has been having sharp pain ever since having the surgery.  She says that the pain is usually 4 out of 10 but when she is active such as at work, it is increased to an 8 out of 10.  She says the pain is not associated with any nausea, vomiting or diarrhea.  She says that she made an appointment with her surgeons this coming Tuesday but says that she has been putting this off for too long without it being evaluated and that is why she came to the emergency department tonight.  She denies any vaginal bleeding or discharge.  Denies any urinary frequency or burning with urination but says that she is prone to urinary tract infections.   Past Medical History:  Diagnosis Date  . Migraines     There are no active problems to display for this patient.   Past Surgical History:  Procedure Laterality Date  . CESAREAN SECTION     X 3  . CHOLECYSTECTOMY    . GASTRECTOMY    . tubaligation    . UPPER GASTROINTESTINAL ENDOSCOPY      Prior to Admission medications   Medication Sig Start Date End Date Taking? Authorizing Provider  oxyCODONE-acetaminophen (PERCOCET/ROXICET) 5-325 MG tablet Take 1 tablet by mouth every 4 (four) hours as needed for severe pain.    [provider]    Allergies Nsaids  No family history on file.  Social History Social History    Tobacco Use  . Smoking status: Never Smoker  . Smokeless tobacco: Never Used  Substance Use Topics  . Alcohol use: No  . Drug use: No    Review of Systems  Constitutional: No fever/chills Eyes: No visual changes. ENT: No sore throat. Cardiovascular: Denies chest pain. Respiratory: Denies shortness of breath. Gastrointestinal:  No nausea, no vomiting.  No diarrhea.  No constipation. Genitourinary: Negative for dysuria. Musculoskeletal: Negative for back pain. Skin: Negative for rash. Neurological: Negative for headaches, focal weakness or numbness.   ____________________________________________   PHYSICAL EXAM:  VITAL SIGNS: ED Triage Vitals [12/03/17 2048]  Enc Vitals Group     BP      Pulse      Resp      Temp      Temp src      SpO2      Weight 150 lb (68 kg)     Height 5\' 7"  (1.702 m)     Head Circumference      Peak Flow      Pain Score 8     Pain Loc      Pain Edu?      Excl. in GC?     Constitutional: Alert and oriented. Well appearing and in no acute distress. Eyes: Conjunctivae are normal.  Head: Atraumatic. Nose: No congestion/rhinnorhea. Mouth/Throat: Mucous membranes are moist.  Neck: No stridor.   Cardiovascular: Normal rate, regular  rhythm. Grossly normal heart sounds.  Respiratory: Normal respiratory effort.  No retractions. Lungs CTAB. Gastrointestinal: Soft with moderate right upper quadrant abdominal tenderness to palpation without any rebound or guarding.  Negative Murphy sign. No distention. No CVA tenderness. Musculoskeletal: No lower extremity tenderness nor edema.   Neurologic:  Normal speech and language. No gross focal neurologic deficits are appreciated. Skin:  Skin is warm, dry and intact. No rash noted. Psychiatric: Mood and affect are normal. Speech and behavior are normal.  ____________________________________________   LABS (all labs ordered are listed, but only abnormal results are displayed)  Labs Reviewed  CBC -  Abnormal; Notable for the following components:      Result Value   MCV 78.8 (*)    RDW 15.1 (*)    All other components within normal limits  URINALYSIS, COMPLETE (UACMP) WITH MICROSCOPIC - Abnormal; Notable for the following components:   Color, Urine YELLOW (*)    APPearance HAZY (*)    Ketones, ur 5 (*)    Nitrite POSITIVE (*)    Bacteria, UA MANY (*)    Squamous Epithelial / LPF 0-5 (*)    All other components within normal limits  URINE CULTURE  LIPASE, BLOOD  COMPREHENSIVE METABOLIC PANEL  PREGNANCY, URINE  POC URINE PREG, ED   ____________________________________________  EKG   ____________________________________________  RADIOLOGY  Pending [place] ____________________________________________   PROCEDURES  Procedure(s) performed:   Procedures  Critical Care performed:   ____________________________________________   INITIAL IMPRESSION / ASSESSMENT AND PLAN / ED COURSE  Pertinent labs & imaging results that were available during my care of the patient were reviewed by me and considered in my medical decision making (see chart for details).  Differential diagnosis includes, but is not limited to, biliary disease (biliary colic, acute cholecystitis, cholangitis, choledocholithiasis, etc), intrathoracic causes for epigastric abdominal pain including ACS, gastritis, duodenitis, pancreatitis, small bowel or large bowel obstruction, abdominal aortic aneurysm, hernia, and gastritis. As part of my medical decision making, I reviewed the following data within the electronic MEDICAL RECORD NUMBER Old chart reviewed  ----------------------------------------- 11:10 PM on 12/03/2017 -----------------------------------------  Patient at this time pending ultrasound.  Patient says that she is established an appointment for this Tuesday with her surgeons at [facility].  Signed out to Dr. Manson Passey.      ____________________________________________   FINAL CLINICAL IMPRESSION(S) /  ED DIAGNOSES  Right upper quadrant abdominal pain.  UTI.    NEW MEDICATIONS STARTED DURING THIS VISIT:  New Prescriptions   No medications on file     Note:  This document was prepared using Dragon voice recognition software and may include unintentional dictation errors.     Myrna Blazer, MD 12/03/17 [phone]

## 2017-12-03 NOTE — ED Triage Notes (Signed)
Pt comes into the ED via POV c/o RUQ abdominal pain that is described as a burning sharp pain.  Patient denies any N/V/D, chest pain, or shortness of breath.  Patient states she has already had her gallbladder out but the pain is where that pain used to be.  Patient in NAD at this time with even and unlabored respirations.  Patient states the pain has been going on since November and the pain has been there since the surgery was completed on November 19.  Patient also states that she had the weight loss gastric sleeve completed.

## 2017-12-03 NOTE — ED Notes (Signed)
Patient transported to Ultrasound 

## 2017-12-04 NOTE — ED Provider Notes (Signed)
I assumed care of the patient from Dr. Pershing ProudSchaevitz at 11:00 PM with recommendation to follow-up on ultrasound and if negative patient be able to be discharged home.  Ultrasound revealed no acute pathology and as such patient was discharged home urinalysis consistent with Schaevitz prescribed Macrobid.  I spoke with the patient regarding urine culture and possibility of antibiotic resistance and as such she would be notified if that were the case.  Patient discharged home.   Darci CurrentBrown, [place], MD 12/04/17 Katherine Dillon

## 2017-12-06 LAB — URINE CULTURE: Culture: >=100000 — AB

## 2019-05-23 IMAGING — US US PELVIS COMPLETE
1 series · 13 of 25 positions shown · non-contrast
Comparison: None.

CLINICAL DATA: Patient with right lower quadrant abdominal pain.
Evaluate for torsion.

EXAM:
TRANSABDOMINAL ULTRASOUND OF PELVIS
DOPPLER ULTRASOUND OF OVARIES
TECHNIQUE: Transabdominal ultrasound examination of the pelvis was performed
including evaluation of the uterus, ovaries, adnexal regions, and
pelvic cul-de-sac.
Color and duplex Doppler ultrasound was utilized to evaluate blood
flow to the ovaries.

[Series 1: us pelvis complete · 0.23mm/px · 13 of 79 slices shown]
[im 1/79]
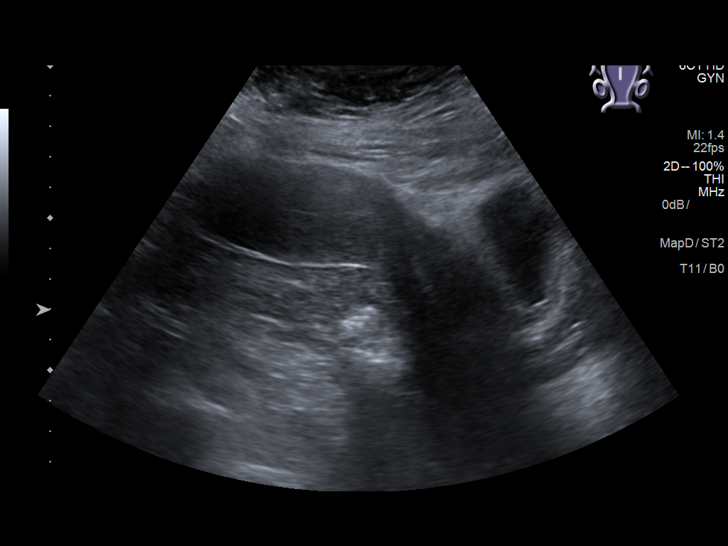
[im 7/79]
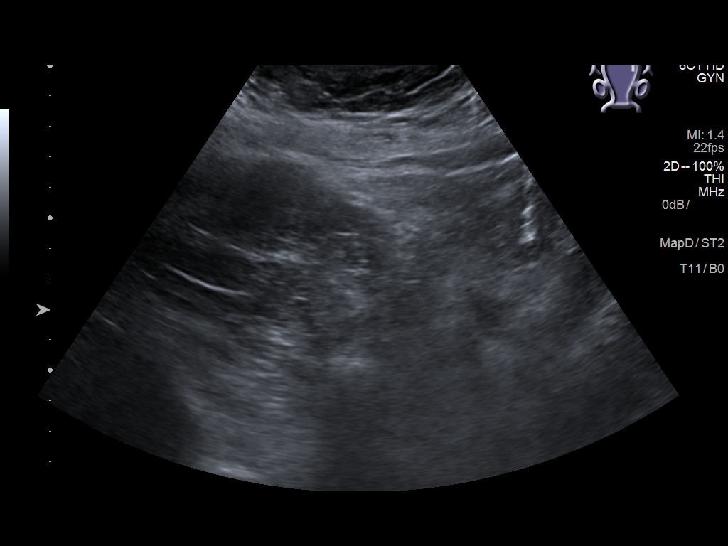
[im 14/79]
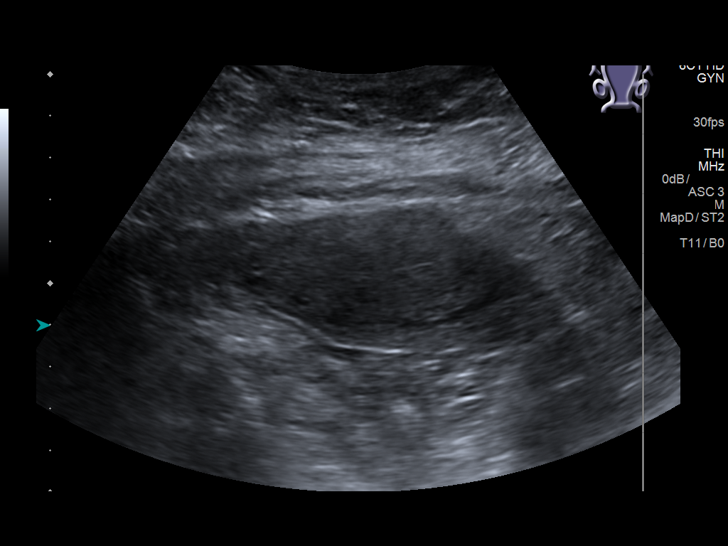
[im 20/79]
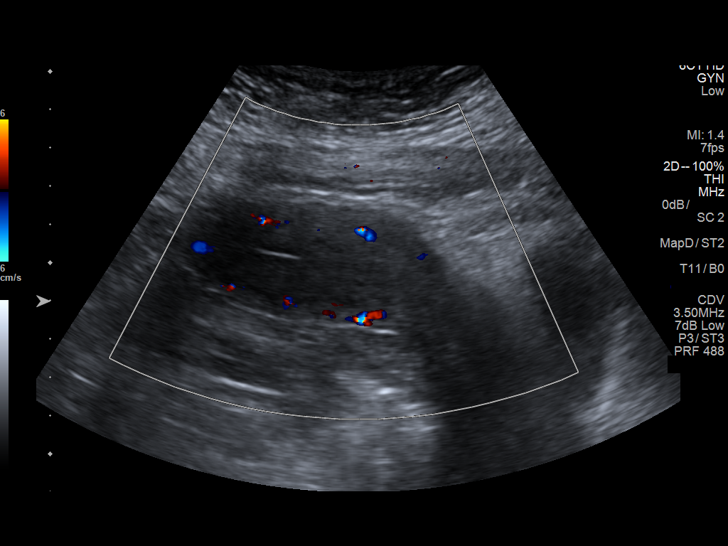
[im 27/79]
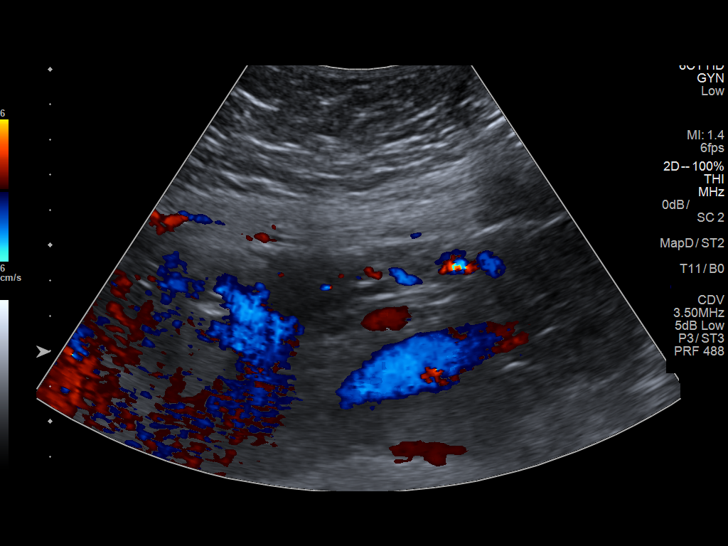
[im 33/79]
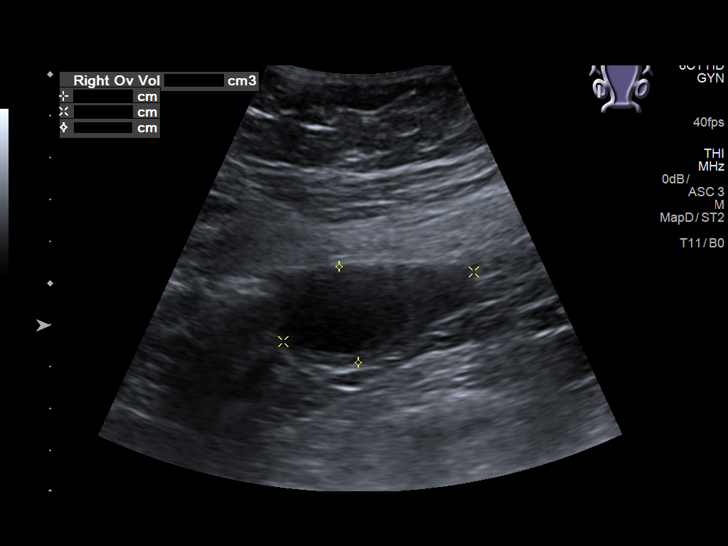
[im 40/79]
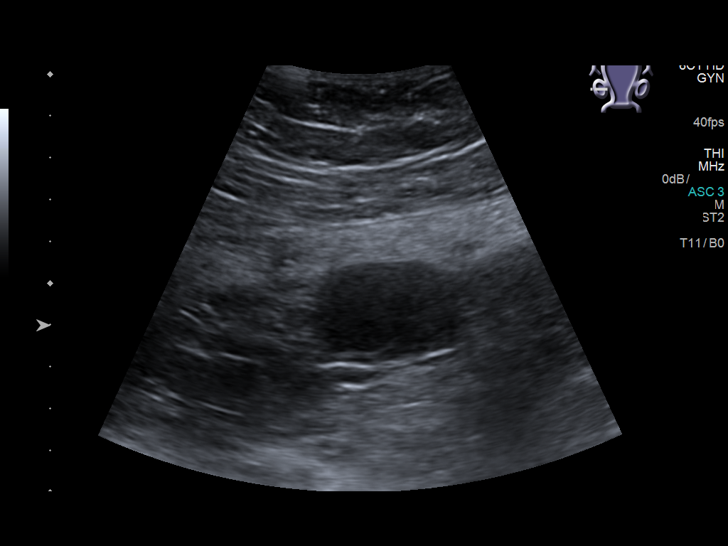
[im 46/79]
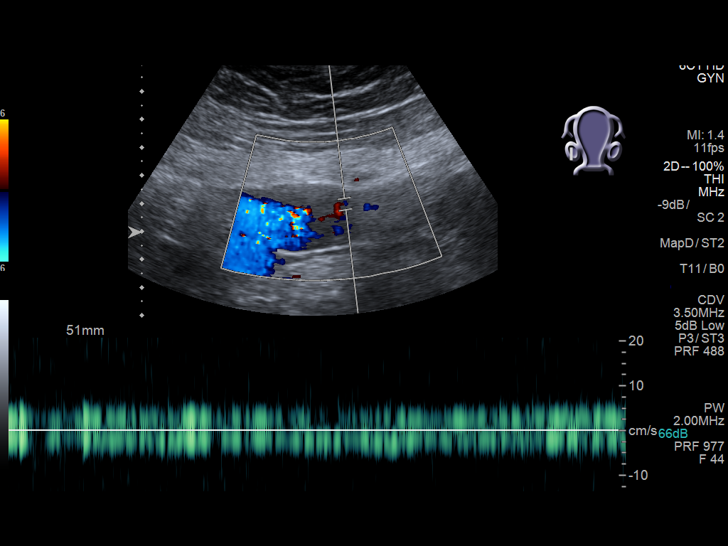
[im 53/79]
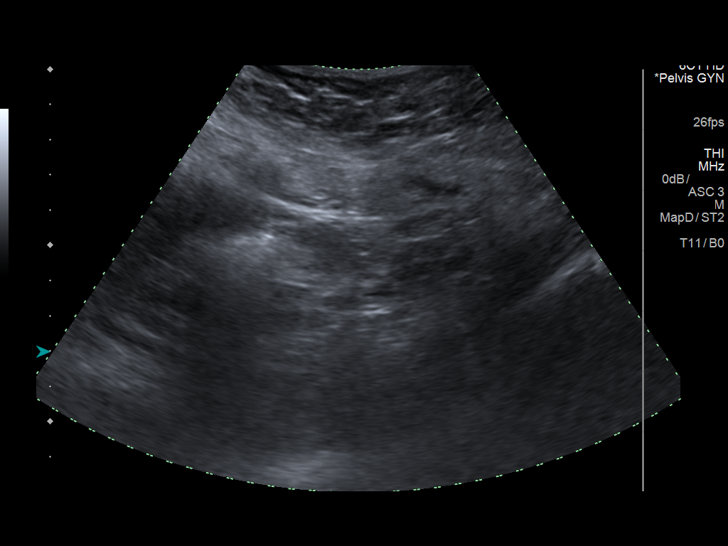
[im 59/79]
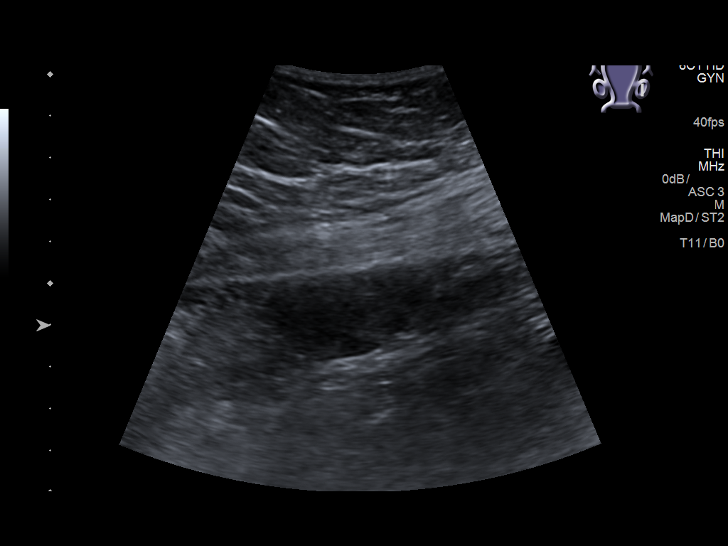
[im 66/79]
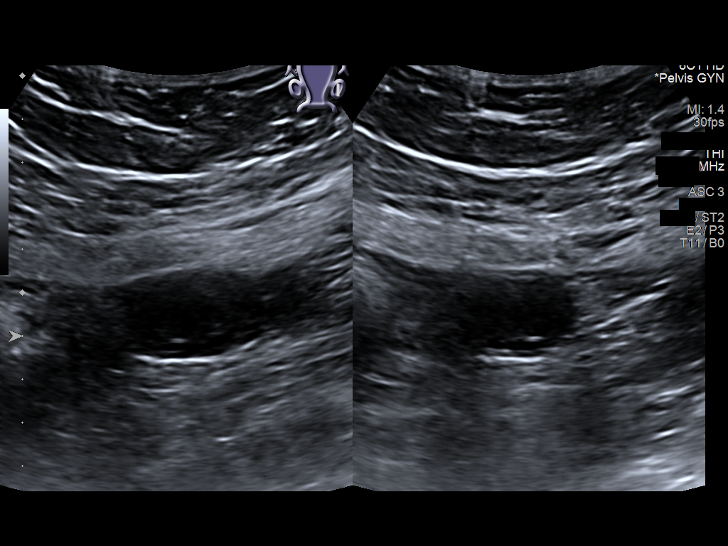
[im 72/79]
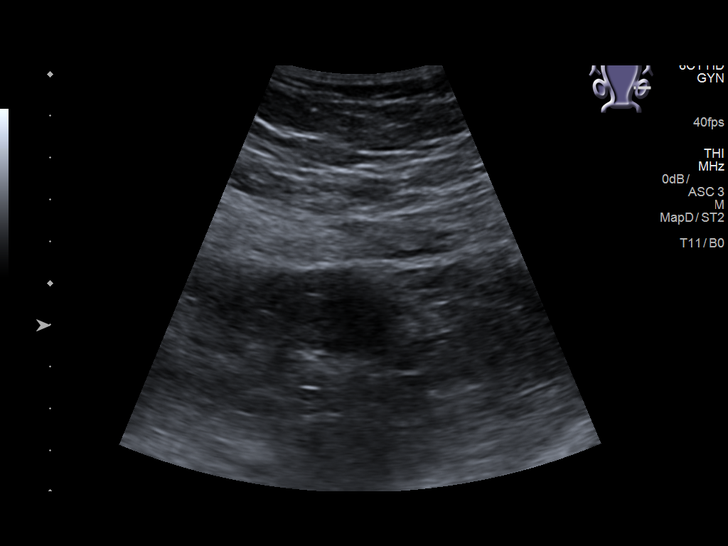
[im 79/79]
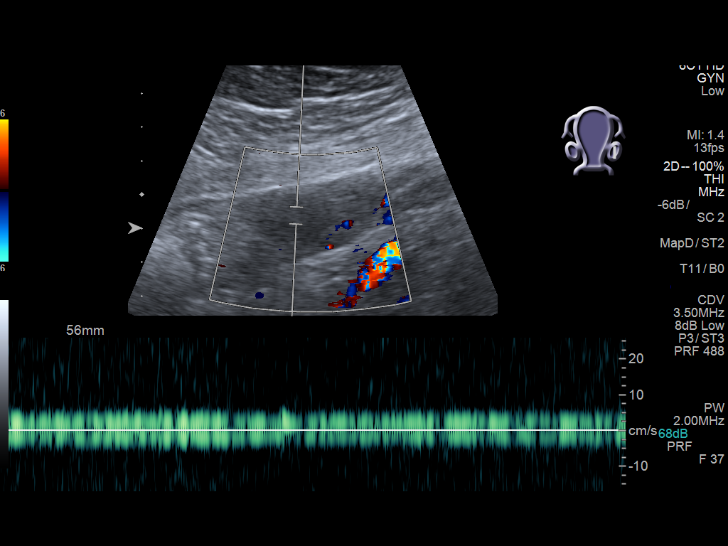

[13 of 25 positions shown; findings below may reference images not displayed]

FINDINGS: Uterus

Measurements: 11.7 x 3.3 x 5.6 cm. No fibroids or other mass
visualized.

Endometrium

Thickness: 4 mm. No focal abnormality visualized.

Right ovary

Measurements: 4.9 x 2.3 x 3.2 cm. Within the right ovary there is a
2.7 x 1.8 x 2.5 cm hypoechoic lesion, potentially representing a
hemorrhagic cyst.

Left ovary

Measurements: 4.0 x 2.1 x 2.8 cm. Normal appearance/no adnexal mass.

Pulsed Doppler evaluation demonstrates normal low-resistance
arterial and venous waveforms in both ovaries.
IMPRESSION: Arteriovenous waveforms demonstrated within normal sized ovaries
bilaterally. No sonographic evidence to suggest torsion.

Within the right ovary there is a prominent follicle versus
hemorrhagic cyst, poorly visualized on current exam. Recommend
follow-up pelvic ultrasound in 6-8 weeks to ensure resolution.

## 2019-12-10 IMAGING — US US ABDOMEN LIMITED
1 series · 14 of 25 positions shown · non-contrast
Comparison: CT abdomen and pelvis 05/16/2017

CLINICAL DATA: Right upper quadrant pain since cholecystectomy in
Saturday September, 2017

EXAM:
ULTRASOUND ABDOMEN LIMITED RIGHT UPPER QUADRANT

[Series 1: us abdomen limited · 0.23mm/px · 14 of 48 slices shown]
[im 1/48]
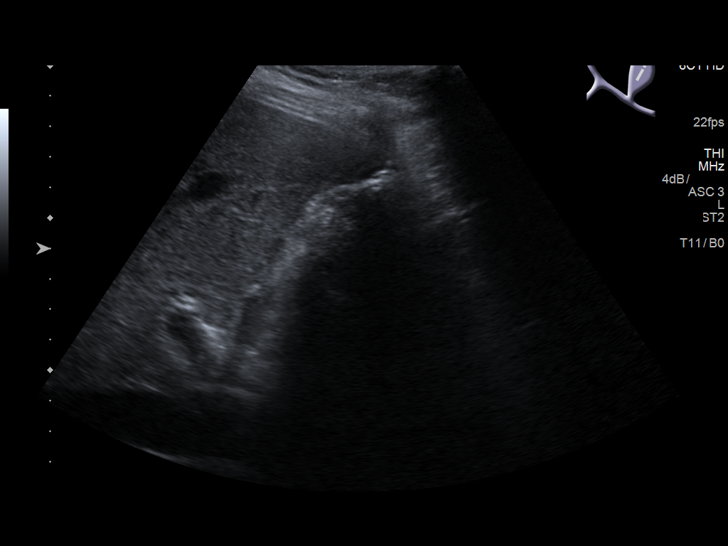
[im 4/48]
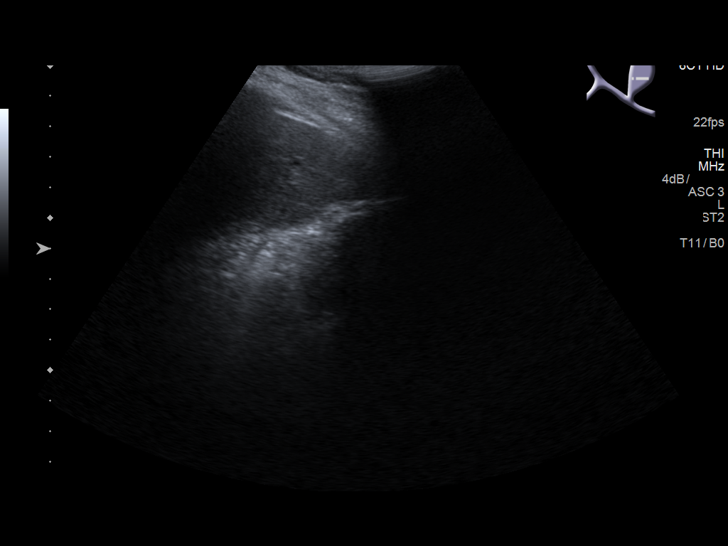
[im 8/48]
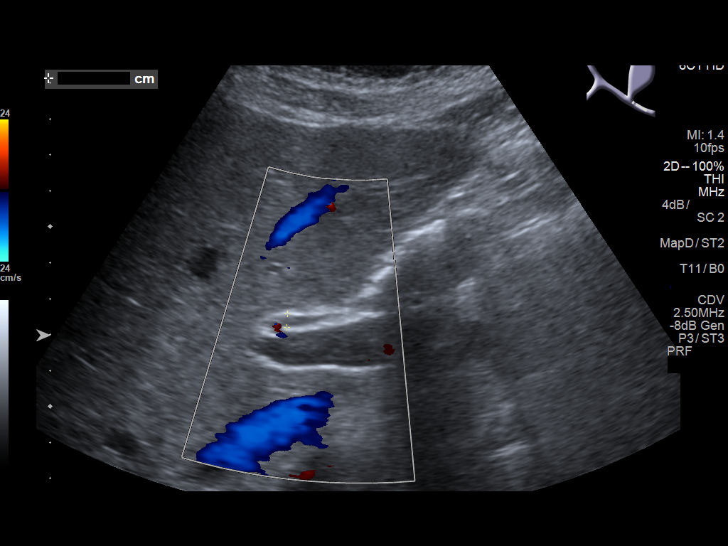
[im 12/48]
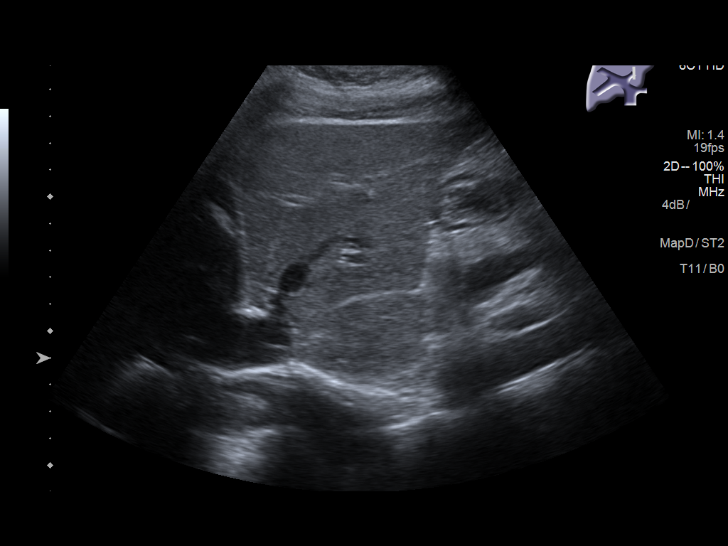
[im 16/48]
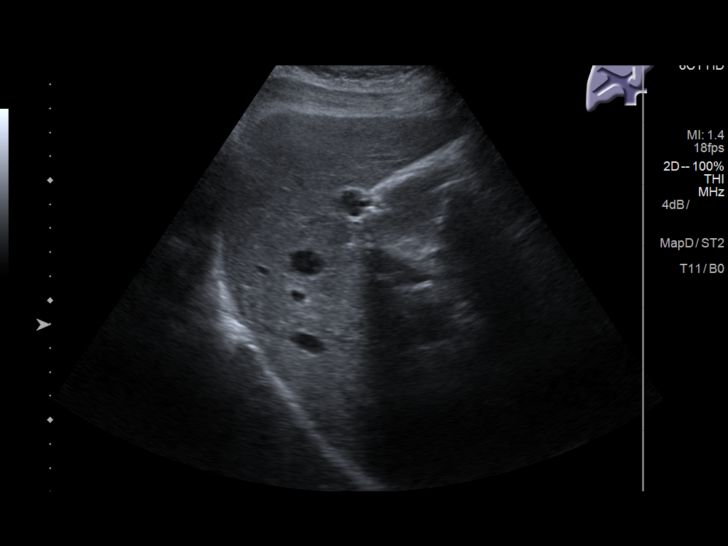
[im 18/48]
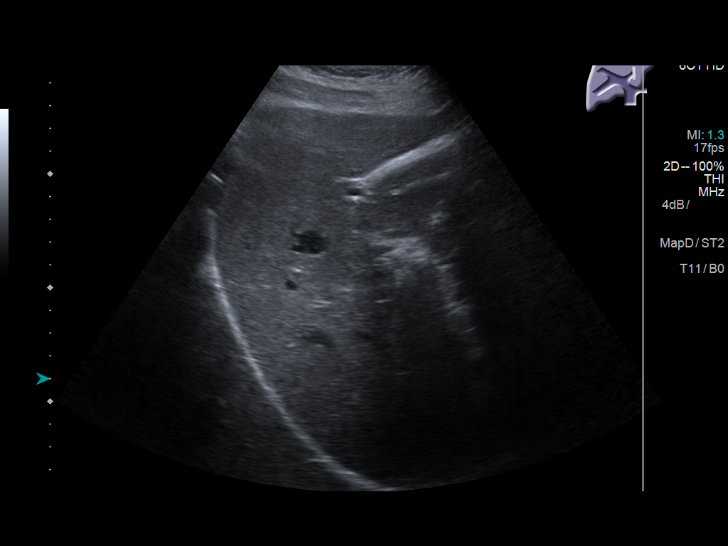
[im 22/48]
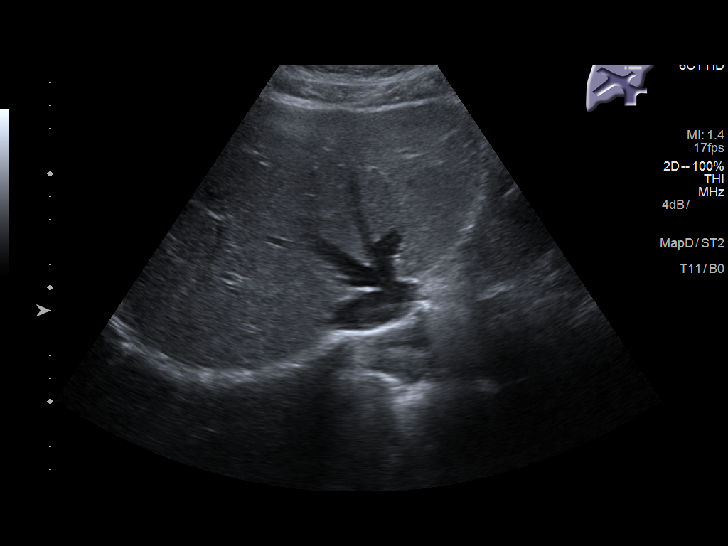
[im 26/48]
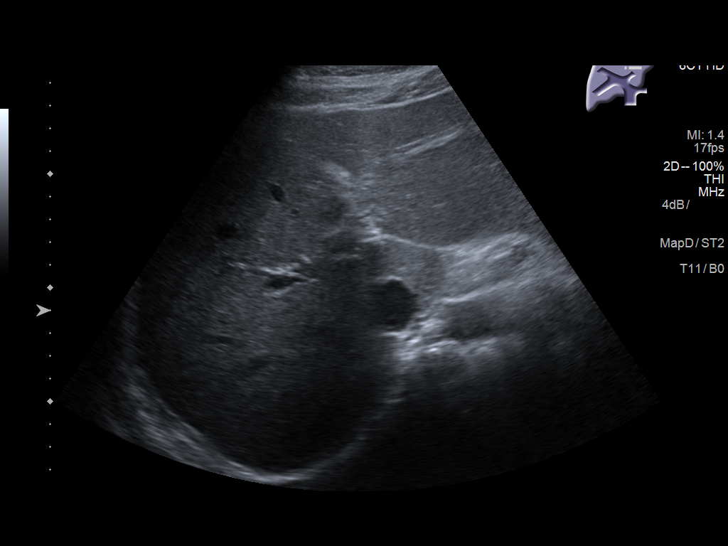
[im 30/48]
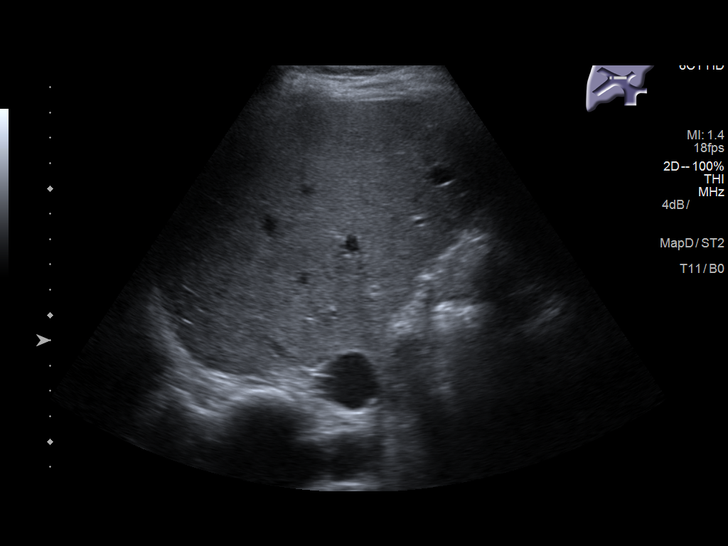
[im 32/48]
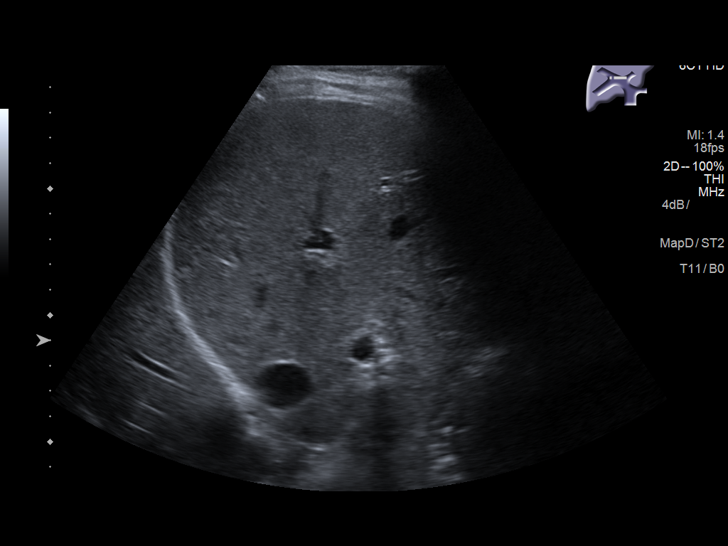
[im 36/48]
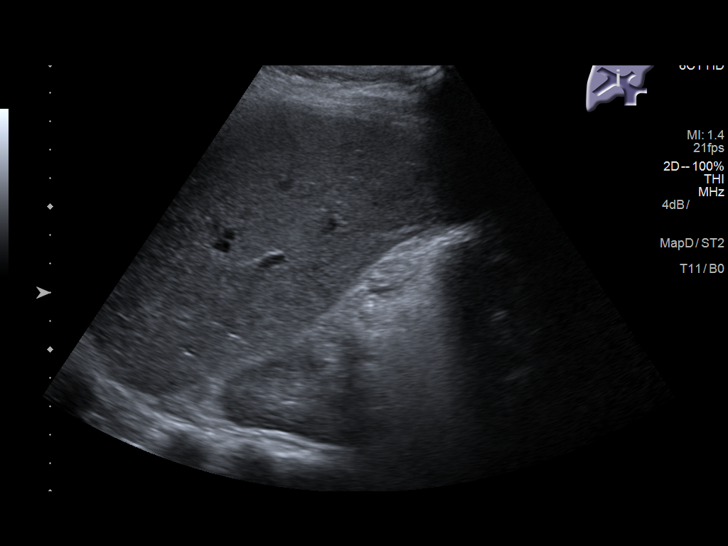
[im 40/48]
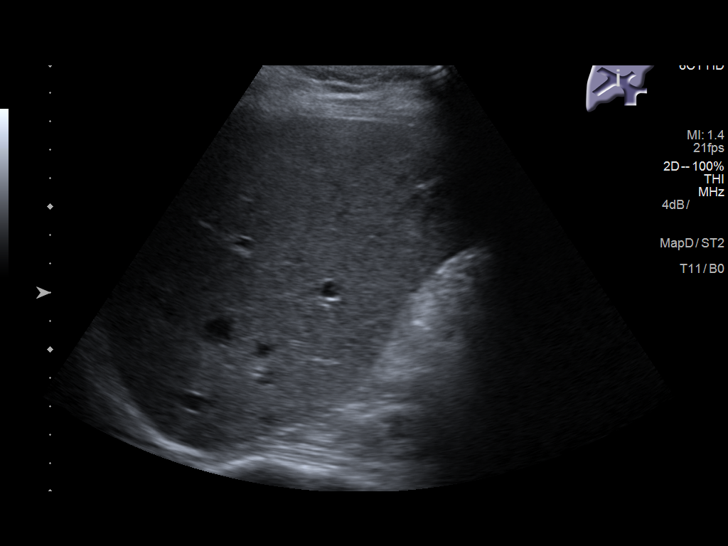
[im 44/48]
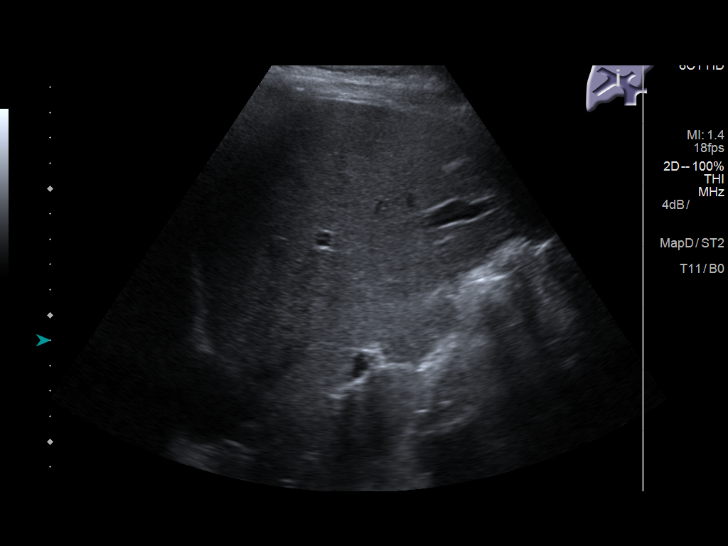
[im 48/48]
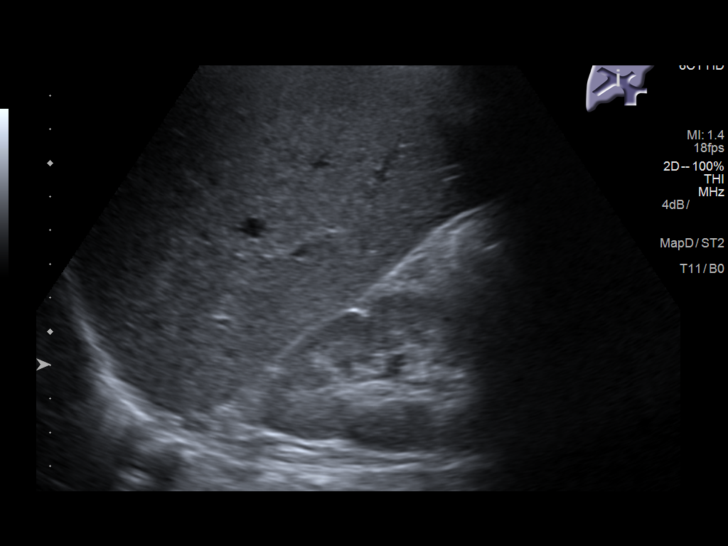

[14 of 25 positions shown; findings below may reference images not displayed]

FINDINGS: Gallbladder:

Gallbladder is surgically absent. No abnormal mass or fluid
collection demonstrated in the visualized gallbladder fossa.

Common bile duct:

Diameter: 3.8 mm, normal

Liver:

No focal lesion identified. Within normal limits in parenchymal
echogenicity. Portal vein is patent on color Doppler imaging with
normal direction of blood flow towards the liver.
IMPRESSION: Previous cholecystectomy.  Examination is otherwise normal.
